# Patient Record
Sex: Male | Born: 1980 | Race: White | Hispanic: No | Marital: Married
Health system: Southern US, Community
[De-identification: ages and names within clinical notes are randomized; demographics above are authoritative.]

## PROBLEM LIST (undated history)

## (undated) DIAGNOSIS — H538 Other visual disturbances: Secondary | ICD-10-CM

## (undated) DIAGNOSIS — G8929 Other chronic pain: Secondary | ICD-10-CM

## (undated) DIAGNOSIS — M542 Cervicalgia: Secondary | ICD-10-CM

## (undated) DIAGNOSIS — M549 Dorsalgia, unspecified: Secondary | ICD-10-CM

## (undated) DIAGNOSIS — R51 Headache: Secondary | ICD-10-CM

## (undated) DIAGNOSIS — R519 Headache, unspecified: Secondary | ICD-10-CM

---

## 2012-08-26 ENCOUNTER — Encounter (HOSPITAL_COMMUNITY): Payer: Self-pay | Admitting: *Deleted

## 2012-08-26 ENCOUNTER — Emergency Department (HOSPITAL_COMMUNITY): Payer: Worker's Compensation

## 2012-08-26 ENCOUNTER — Emergency Department (HOSPITAL_COMMUNITY)
Admission: EM | Admit: 2012-08-26 | Discharge: 2012-08-26 | Disposition: A | Discharge status: Home / Self Care | Payer: Worker's Compensation | Attending: Emergency Medicine | Admitting: Emergency Medicine

## 2012-08-26 DIAGNOSIS — IMO0002 Reserved for concepts with insufficient information to code with codable children: Secondary | ICD-10-CM | POA: Insufficient documentation

## 2012-08-26 DIAGNOSIS — Y92410 Unspecified street and highway as the place of occurrence of the external cause: Secondary | ICD-10-CM | POA: Insufficient documentation

## 2012-08-26 DIAGNOSIS — Y93I9 Activity, other involving external motion: Secondary | ICD-10-CM | POA: Insufficient documentation

## 2012-08-26 LAB — RAPID URINE DRUG SCREEN, HOSP PERFORMED
Amphetamines: NOT DETECTED
Benzodiazepines: NOT DETECTED
Opiates: NOT DETECTED

## 2012-08-26 MED ORDER — DIAZEPAM 5 MG PO TABS: 5.0000 mg | ORAL_TABLET | Freq: Two times a day (BID) | ORAL | Status: DC

## 2012-08-26 MED ORDER — HYDROCODONE-ACETAMINOPHEN 5-325 MG PO TABS
2.0000 | ORAL_TABLET | Freq: Once | ORAL | Status: AC
  Administered 2012-08-26: 2 via ORAL
  Filled 2012-08-26: qty 2

## 2012-08-26 MED ORDER — ONDANSETRON 4 MG PO TBDP
8.0000 mg | ORAL_TABLET | Freq: Once | ORAL | Status: AC
  Administered 2012-08-26: 8 mg via ORAL
  Filled 2012-08-26: qty 2

## 2012-08-26 MED ORDER — HYDROCODONE-ACETAMINOPHEN 5-325 MG PO TABS: 2.0000 | ORAL_TABLET | Freq: Four times a day (QID) | ORAL | Status: DC | PRN

## 2012-08-26 MED ORDER — PROMETHAZINE HCL 25 MG PO TABS: 25.0000 mg | ORAL_TABLET | Freq: Four times a day (QID) | ORAL | Status: DC | PRN

## 2012-08-26 NOTE — ED Notes (Signed)
Pt states he needs to have a drug test for worker's compensation.

## 2012-08-26 NOTE — ED Notes (Signed)
Pt unable to void at the time of the rapid urine drug screen.

## 2012-08-26 NOTE — ED Notes (Addendum)
Pt given urinal to pee in, pt states he needs a urine drug screen for worker's compensation.
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
## 2012-08-26 NOTE — ED Notes (Signed)
Phlebotomy consulted to perform a urine drug screen per worker's compensation.

## 2012-08-26 NOTE — ED Notes (Signed)
Waiting to give pt his medications as he is about to take a urine drug screen.

## 2012-08-27 NOTE — ED Provider Notes (Signed)
Medical screening examination/treatment/procedure(s) were performed by non-physician practitioner and as supervising physician I was immediately available for consultation/collaboration.  Charles B. Bernette Mayers, MD 08/27/12 1156

## 2012-08-29 ENCOUNTER — Emergency Department (HOSPITAL_COMMUNITY): Payer: Worker's Compensation

## 2012-08-29 ENCOUNTER — Encounter (HOSPITAL_COMMUNITY): Payer: Self-pay | Admitting: Cardiology

## 2012-08-29 ENCOUNTER — Emergency Department (HOSPITAL_COMMUNITY)
Admission: EM | Admit: 2012-08-29 | Discharge: 2012-08-29 | Disposition: A | Discharge status: Home / Self Care | Payer: Worker's Compensation | Attending: Emergency Medicine | Admitting: Emergency Medicine

## 2012-08-29 DIAGNOSIS — M549 Dorsalgia, unspecified: Secondary | ICD-10-CM

## 2012-08-29 DIAGNOSIS — H538 Other visual disturbances: Secondary | ICD-10-CM

## 2012-08-29 DIAGNOSIS — R42 Dizziness and giddiness: Secondary | ICD-10-CM | POA: Insufficient documentation

## 2012-08-29 DIAGNOSIS — Z79899 Other long term (current) drug therapy: Secondary | ICD-10-CM | POA: Insufficient documentation

## 2012-08-29 DIAGNOSIS — I498 Other specified cardiac arrhythmias: Secondary | ICD-10-CM | POA: Insufficient documentation

## 2012-08-29 DIAGNOSIS — G8921 Chronic pain due to trauma: Secondary | ICD-10-CM | POA: Insufficient documentation

## 2012-08-29 MED ORDER — KETOROLAC TROMETHAMINE 60 MG/2ML IM SOLN
60.0000 mg | Freq: Once | INTRAMUSCULAR | Status: AC
  Administered 2012-08-29: 60 mg via INTRAMUSCULAR
  Filled 2012-08-29: qty 2

## 2012-08-29 MED ORDER — METOCLOPRAMIDE HCL 10 MG PO TABS
10.0000 mg | ORAL_TABLET | Freq: Once | ORAL | Status: AC
  Administered 2012-08-29: 10 mg via ORAL
  Filled 2012-08-29 (×2): qty 1

## 2012-08-29 MED ORDER — CYCLOBENZAPRINE HCL 10 MG PO TABS: 10.0000 mg | ORAL_TABLET | Freq: Two times a day (BID) | ORAL | Status: DC | PRN

## 2012-08-29 MED ORDER — DIPHENHYDRAMINE HCL 50 MG/ML IJ SOLN
50.0000 mg | Freq: Once | INTRAMUSCULAR | Status: AC
  Administered 2012-08-29: 50 mg via INTRAMUSCULAR
  Filled 2012-08-29: qty 1

## 2012-08-29 MED ORDER — DIPHENHYDRAMINE HCL 25 MG PO CAPS
50.0000 mg | ORAL_CAPSULE | Freq: Once | ORAL | Status: AC
  Administered 2012-08-29: 50 mg via ORAL
  Filled 2012-08-29: qty 2

## 2012-08-29 MED ORDER — METOCLOPRAMIDE HCL 5 MG/ML IJ SOLN
10.0000 mg | Freq: Once | INTRAMUSCULAR | Status: AC
  Administered 2012-08-29: 10 mg via INTRAMUSCULAR
  Filled 2012-08-29: qty 2

## 2012-08-29 MED ORDER — NAPROXEN 500 MG PO TABS: 500.0000 mg | ORAL_TABLET | Freq: Two times a day (BID) | ORAL | Status: DC
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
## 2012-08-29 NOTE — ED Notes (Signed)
PA at bedside.

## 2012-08-29 NOTE — ED Provider Notes (Signed)
Medical screening examination/treatment/procedure(s) were performed by non-physician practitioner and as supervising physician I was immediately available for consultation/collaboration.  Flint Melter, MD 08/29/12 2124

## 2012-08-31 NOTE — ED Provider Notes (Signed)
Medical screening examination/treatment/procedure(s) were performed by non-physician practitioner and as supervising physician I was immediately available for consultation/collaboration.  Flint Melter, MD 08/31/12 [phone]

## 2012-08-31 NOTE — ED Provider Notes (Signed)
Spoke with patient via telephone on 08/31/2012 at 4:00PM - verified by name, DOB, and address. Patient reported that he was seen at [facility] this morning for epitaxis and headache that has been ongoing for the past couple of days - was discharged. Seen by ophthalmologist on Thursday, 08/29/2012, as per patient he reported that the ophthalmologist stated that eye was fine, nothing traumatic injury, discharged patient with eye drops - patient reported that blurriness to the right eye has improved. patient reported that dizziness is intermittent, more so with position changes. Stated that he just still does not feel like himself. Patient reported that he still has a headache. Reported that his neck and back pain are what is bothering him the most. Patient stated that he reported this to [facility] ED today and discharged the patient, instructed to take Aleve. Denied weakness, facial drooping, slurred speech, difficulty speaking, fainting. Referred patient to neurology - gave address and telephone number. Discussed with patient to continue to follow with orthopedic referral. Discussed with patient that if he continues to have dizziness and continuous headache to report back to the ED immediately - discussed with patient what to watch out for and to report back to the ED ASAP. Patient understood and agreed.      Raymon Mutton, PA-C 08/31/12 1606

## 2012-12-12 ENCOUNTER — Encounter (HOSPITAL_COMMUNITY): Payer: Self-pay | Admitting: Emergency Medicine

## 2012-12-12 ENCOUNTER — Emergency Department (HOSPITAL_COMMUNITY)
Admission: EM | Admit: 2012-12-12 | Discharge: 2012-12-12 | Disposition: A | Discharge status: Home / Self Care | Payer: Worker's Compensation | Attending: Emergency Medicine | Admitting: Emergency Medicine

## 2012-12-12 DIAGNOSIS — M549 Dorsalgia, unspecified: Secondary | ICD-10-CM

## 2012-12-12 DIAGNOSIS — M545 Low back pain, unspecified: Secondary | ICD-10-CM | POA: Insufficient documentation

## 2012-12-12 DIAGNOSIS — Z888 Allergy status to other drugs, medicaments and biological substances status: Secondary | ICD-10-CM | POA: Insufficient documentation

## 2012-12-12 DIAGNOSIS — Z79899 Other long term (current) drug therapy: Secondary | ICD-10-CM | POA: Insufficient documentation

## 2012-12-12 DIAGNOSIS — R42 Dizziness and giddiness: Secondary | ICD-10-CM | POA: Insufficient documentation

## 2012-12-12 DIAGNOSIS — G8929 Other chronic pain: Secondary | ICD-10-CM | POA: Insufficient documentation

## 2012-12-12 DIAGNOSIS — R51 Headache: Secondary | ICD-10-CM | POA: Insufficient documentation

## 2012-12-12 DIAGNOSIS — Z8669 Personal history of other diseases of the nervous system and sense organs: Secondary | ICD-10-CM | POA: Insufficient documentation

## 2012-12-12 DIAGNOSIS — M542 Cervicalgia: Secondary | ICD-10-CM | POA: Insufficient documentation

## 2012-12-12 DIAGNOSIS — R61 Generalized hyperhidrosis: Secondary | ICD-10-CM | POA: Insufficient documentation

## 2012-12-12 HISTORY — DX: Headache: R51

## 2012-12-12 HISTORY — DX: Other visual disturbances: H53.8

## 2012-12-12 HISTORY — DX: Cervicalgia: M54.2

## 2012-12-12 HISTORY — DX: Headache, unspecified: R51.9

## 2012-12-12 HISTORY — DX: Other chronic pain: G89.29

## 2012-12-12 HISTORY — DX: Dorsalgia, unspecified: M54.9

## 2012-12-12 MED ORDER — OXYCODONE-ACETAMINOPHEN 5-325 MG PO TABS: 1.0000 | ORAL_TABLET | Freq: Three times a day (TID) | ORAL | Status: AC | PRN

## 2012-12-12 MED ORDER — OXYCODONE-ACETAMINOPHEN 5-325 MG PO TABS
2.0000 | ORAL_TABLET | Freq: Once | ORAL | Status: AC
  Administered 2012-12-12: 2 via ORAL
  Filled 2012-12-12: qty 2

## 2012-12-12 MED ORDER — METHOCARBAMOL 500 MG PO TABS: 500.0000 mg | ORAL_TABLET | Freq: Two times a day (BID) | ORAL | Status: AC

## 2012-12-12 MED ORDER — DIAZEPAM 5 MG PO TABS
5.0000 mg | ORAL_TABLET | Freq: Once | ORAL | Status: AC
  Administered 2012-12-12: 5 mg via ORAL
  Filled 2012-12-12: qty 1
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
## 2012-12-15 NOTE — ED Provider Notes (Signed)
Medical screening examination/treatment/procedure(s) were performed by non-physician practitioner and as supervising physician I was immediately available for consultation/collaboration.  EKG Interpretation   None         Kristell Wooding M Jhamal Plucinski, DO 12/15/12 1405 
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
